# Patient Record
Sex: Female | Born: 1955 | Race: White | Hispanic: No | Marital: Married
Health system: Midwestern US, Community
[De-identification: ages and names within clinical notes are randomized; demographics above are authoritative.]

## PROBLEM LIST (undated history)

## (undated) DIAGNOSIS — J45909 Unspecified asthma, uncomplicated: Secondary | ICD-10-CM

## (undated) DIAGNOSIS — Z139 Encounter for screening, unspecified: Secondary | ICD-10-CM

## (undated) DIAGNOSIS — N840 Polyp of corpus uteri: Secondary | ICD-10-CM

## (undated) HISTORY — PX: TONSILLECTOMY AND ADENOIDECTOMY: SUR1326

## (undated) HISTORY — PX: DILATION AND CURETTAGE OF UTERUS: SHX78

---

## 2011-08-29 LAB — HM PAP SMEAR

## 2011-10-05 LAB — URINALYSIS W/ RFLX MICROSCOPIC
Bilirubin: NEGATIVE
Blood: NEGATIVE
Glucose: NEGATIVE MG/DL
Ketone: NEGATIVE MG/DL
Leukocyte Esterase: NEGATIVE
Nitrites: NEGATIVE
Protein: NEGATIVE MG/DL
Specific gravity: 1.006 (ref 1.001–1.023)
Urobilinogen: 0.2 EU/DL (ref 0.2–1.0)
pH (UA): 7 (ref 5.0–9.0)

## 2011-10-05 LAB — CBC W/O DIFF
HCT: 34.3 % — ABNORMAL LOW (ref 35.8–46.3)
HGB: 11.7 g/dL (ref 11.7–15.4)
MCH: 27.7 PG (ref 26.1–32.9)
MCHC: 34.1 g/dL (ref 31.4–35.0)
MCV: 81.1 FL (ref 79.6–97.8)
MPV: 9.9 FL — ABNORMAL LOW (ref 10.8–14.1)
PLATELET: 184 10*3/uL (ref 150–450)
RBC: 4.23 M/uL (ref 4.05–5.25)
RDW: 13.2 % (ref 11.9–14.6)
WBC: 9.8 10*3/uL (ref 4.3–11.1)

## 2011-10-05 NOTE — Other (Signed)
Verified with patient: Patient's name, name of surgeon, procedure to be performed and date of procedure. All correct in system.    Pt declined offer to see anesthesia today.  Pt aware will meet MDA in preop DOS.    Labs done per anesthesia protocols / surgeon orders: CBC and UA done per surgeon's orders (pending results). No other labs or EKG needed per anesthesia protocols or surgeon orders.    TYPE -   1B   surgery.    Patient given opportunity to ask questions related to instructions given during pre- assessment.  All questions answered. Informed pt to refer to printed materials provided and to call with any questions between now and the DOS. Phone # printed on materials given to patient.

## 2011-10-11 NOTE — Other (Signed)
CBC & UA results from 10/05/11 WNL for surgery per anesthesia guidelines.

## 2011-10-12 ENCOUNTER — Inpatient Hospital Stay: Payer: PRIVATE HEALTH INSURANCE

## 2011-10-12 LAB — HCG URINE, QL. - POC: Pregnancy test,urine (POC): NEGATIVE

## 2011-10-12 MED ADMIN — midazolam (VERSED) injection 2 mg: INTRAVENOUS | @ 17:00:00 | NDC 63323041112

## 2011-10-12 MED ADMIN — acetaminophen (OFIRMEV) infusion 1,000 mg: INTRAVENOUS | @ 18:00:00 | NDC 43825010201

## 2011-10-12 MED ADMIN — ketorolac (TORADOL) injection 30 mg: INTRAVENOUS | @ 18:00:00 | NDC 64679075801

## 2011-10-12 MED ADMIN — lactated ringers infusion: INTRAVENOUS | @ 17:00:00 | NDC 00409795309

## 2011-10-12 MED ADMIN — lidocaine (PF) (XYLOCAINE) 20 mg/mL (2 %) injection: INTRAVENOUS | @ 17:00:00 | NDC 00409428202

## 2011-10-12 MED ADMIN — famotidine (PEPCID) tablet 20 mg: ORAL | @ 17:00:00 | NDC 68084017211

## 2011-10-12 MED ADMIN — dexamethasone (DECADRON) injection: INTRAVENOUS | @ 17:00:00 | NDC 00641036725

## 2011-10-12 MED ADMIN — ondansetron (ZOFRAN) injection: INTRAVENOUS | @ 17:00:00 | NDC 00781301095

## 2011-10-12 MED ADMIN — fentaNYL citrate (PF) injection: INTRAVENOUS | @ 17:00:00 | NDC 10019003867

## 2011-10-12 MED ADMIN — lidocaine (XYLOCAINE) 10 mg/mL (1 %) injection 0.1 mL: SUBCUTANEOUS | @ 17:00:00 | NDC 00409427601

## 2011-10-12 MED ADMIN — propofol (DIPRIVAN) 10 mg/mL injection: INTRAVENOUS | @ 17:00:00 | NDC 00703285604

## 2011-10-12 MED ADMIN — lactated ringers infusion: INTRAVENOUS | @ 18:00:00 | NDC 00409795309

## 2011-10-12 MED FILL — KETOROLAC TROMETHAMINE 30 MG/ML INJECTION: 30 mg/mL (1 mL) | INTRAMUSCULAR | Qty: 1

## 2011-10-12 MED FILL — FENTANYL CITRATE (PF) 50 MCG/ML IJ SOLN: 50 mcg/mL | INTRAMUSCULAR | Qty: 2

## 2011-10-12 MED FILL — LIDOCAINE (PF) 20 MG/ML (2 %) IV SYRINGE: 100 mg/5 mL (2 %) | INTRAVENOUS | Qty: 60

## 2011-10-12 MED FILL — PROPOFOL 10 MG/ML IV EMUL: 10 mg/mL | INTRAVENOUS | Qty: 20

## 2011-10-12 MED FILL — MIDAZOLAM 1 MG/ML IJ SOLN: 1 mg/mL | INTRAMUSCULAR | Qty: 2

## 2011-10-12 MED FILL — FAMOTIDINE 20 MG TAB: 20 mg | ORAL | Qty: 1

## 2011-10-12 MED FILL — OFIRMEV 1,000 MG/100 ML (10 MG/ML) INTRAVENOUS SOLUTION: 1000 mg/100 mL (10 mg/mL) | INTRAVENOUS | Qty: 100

## 2011-10-12 MED FILL — ONDANSETRON (PF) 4 MG/2 ML INJECTION: 4 mg/2 mL | INTRAMUSCULAR | Qty: 2

## 2011-10-12 MED FILL — DEXAMETHASONE SODIUM PHOSPHATE 10 MG/ML IJ SOLN: 10 mg/mL | INTRAMUSCULAR | Qty: 1

## 2011-10-12 NOTE — Anesthesia Post-Procedure Evaluation (Signed)
Post-Anesthesia Evaluation and Assessment    Patient: Leslie Irwin MRN: [id number]  SSN: [id number]    Date of Birth: January 25, 1956  Age: 56 y.o.  Sex: female       Cardiovascular Function/Vital Signs  Visit Vitals   Item Reading   ??? BP 105/61   ??? Pulse 75   ??? Temp 36.4 ??C (97.5 ??F)   ??? Resp 75   ??? Ht 5\' 7"  (1.702 m)   ??? Wt 67.949 kg (149 lb 12.8 oz)   ??? BMI 23.46 kg/m2   ??? SpO2 100%       Patient is status post General anesthesia for Procedure(s):  DILATATION AND CURETTAGE HYSTEROSCOPY .    Nausea/Vomiting: None    Postoperative hydration reviewed and adequate.    Pain:  Pain Scale 1: Numeric (0 - 10) (10/12/11 1236)  Pain Intensity 1: 0 (10/12/11 1236)   Managed    Neurological Status:   Neuro (WDL): Exceptions to WDL (hx of migraines, tx with caffeine) (10/12/11 1257)   At baseline    Mental Status and Level of Consciousness: Alert and oriented to person, place, and time    Pulmonary Status:   O2 Device: Nasal cannula (10/12/11 1351)   Adequate oxygenation and airway patent    Complications related to anesthesia: None    Post-anesthesia assessment completed. No concerns    Signed By: Waymond Cera, MD     October 12, 2011

## 2011-10-12 NOTE — Anesthesia Pre-Procedure Evaluation (Signed)
Anesthetic History              Review of Systems / Medical History  Patient summary reviewed, nursing notes reviewed and pertinent labs reviewed    Pulmonary                 Neuro/Psych              Cardiovascular                Exercise tolerance: >4 METS     GI/Hepatic/Renal                  Endo/Other             Other Findings                        Physical Exam    Airway  Mallampati: II  TM Distance: 4 - 6 cm  Neck ROM: normal range of motion   Mouth opening: Normal     Cardiovascular  Regular rate and rhythm,  S1 and S2 normal,  no murmur, click, rub, or gallop             Dental    Dentition: Upper braces and Lower braces     Pulmonary  Breath sounds clear to auscultation               Abdominal         Other Findings                          Anesthetic Plan    ASA: 2  Anesthesia type: general          Induction: Intravenous  Anesthetic plan and risks discussed with: Patient

## 2011-10-15 NOTE — Op Note (Signed)
HYSTEROSCOPY AND D&C FULL OP NOTE    PATIENT: Leslie Irwin  MRN: [id number]      PREOPERATIVE DIAGNOSIS: UTERINE POLYP    POSTOPERATIVE DIAGNOSIS: UTERINE POLYP, cervical polyp    PROCEDURE: Hysteroscopy, dilatation & curettage    SURGEON: Montavious Wierzba, MD    ANESTHESIA: General.    ESTIMATED BLOOD LOSS:   Minimal    PATHOLOGY: Endometrial curettings    SPECIMENS:  Endometrial curettings    PROCEDURE IN DETAIL: The patient was taken to the operating room where general anesthesia was found to be adequate. Time out was done to confirm the operating procedure, surgeon, patient and site.  Once confirmed by the team, procedure was started. The patient was prepped and draped in the normal sterile fashion. A weighted speculum was placed in the vagina. A large 2-3cm cervical polyp was hanging out of the os.  Metzenbaum scissors and biopsy forceps were used to excise the cervical polyps.  The anterior lip of the cervix was grasped with a single-tooth tenaculum. The uterus was dilated with Hank dilators after sounding to 7 cm. The hysteroscope was introduced into the endometrial cavity. A survey of the endometrial cavity revealed small 2 cm fibroids in the anterior and posterior regions. Both ostia appeared clear and patent, however. No other abnormalities were seen. The hysteroscope was removed and an endometrial curetting was performed.  All specimens were sent to Pathology for examination. All instruments were removed from the vagina. Good hemostasis was assured. The patient tolerated the procedure well. Sponge, lap, and needle counts were correct times two.

## 2011-10-15 NOTE — Op Note (Signed)
HYSTEROSCOPY AND D&C FULL OP NOTE    PATIENT: Leslie Irwin  MRN: [id number]      PREOPERATIVE DIAGNOSIS: UTERINE POLYP    POSTOPERATIVE DIAGNOSIS: UTERINE POLYP, cervical polyp    PROCEDURE: Hysteroscopy, dilatation & curettage    SURGEON: Lora Paula, MD    ANESTHESIA: General.    ESTIMATED BLOOD LOSS:   Minimal    PATHOLOGY: Endometrial curettings    SPECIMENS:  Endometrial curettings    PROCEDURE IN DETAIL: The patient was taken to the operating room where general anesthesia was found to be adequate. Time out was done to confirm the operating procedure, surgeon, patient and site.  Once confirmed by the team, procedure was started. The patient was prepped and draped in the normal sterile fashion. A weighted speculum was placed in the vagina. A large 2-3cm cervical polyp was hanging out of the os.  Metzenbaum scissors and biopsy forceps were used to excise the cervical polyps.  The anterior lip of the cervix was grasped with a single-tooth tenaculum. The uterus was dilated with Hank dilators after sounding to 7 cm. The hysteroscope was introduced into the endometrial cavity. A survey of the endometrial cavity revealed small 2 cm fibroids in the anterior and posterior regions. Both ostia appeared clear and patent, however. No other abnormalities were seen. The hysteroscope was removed and an endometrial curetting was performed.  All specimens were sent to Pathology for examination. All instruments were removed from the vagina. Good hemostasis was assured. The patient tolerated the procedure well. Sponge, lap, and needle counts were correct times two.

## 2011-11-07 NOTE — Progress Notes (Signed)
HISTORY OF PRESENT ILLNESS  Leslie Irwin is a 56 y.o. female.  HPI  Patient presents today for a post-op visit.    Surgery Performed:Hysteroscopy D & C   Surgery Date: 10/12/2011  Pathology: benign cervical polyp, benign endometrial biopsy-- fibroid tissue    Plan: Patient is discharged from further care for this condition; patient will return to clinic in one year for gynecological examination unless new problems arise.  Risks of HRT discussed with the patient in detail,including heart attack, stroke, blood clots, breast cancer, and other risks. Patient understands these risks. Current studies show increase stroke risk to users of ERT. This was strongly stressed.  Review of Systems   Constitutional: Negative.    HENT: Negative.    Eyes: Negative.    Respiratory: Negative.    Cardiovascular: Negative.    Gastrointestinal: Negative.    Genitourinary: Negative.    Musculoskeletal: Negative.    Skin: Negative.    Neurological: Negative.    Endo/Heme/Allergies: Negative.    Psychiatric/Behavioral: Negative.        Physical Exam   Constitutional: She is oriented to person, place, and time. She appears well-developed and well-nourished.   HENT:   Head: Normocephalic and atraumatic.   Eyes: EOM are normal. Pupils are equal, round, and reactive to light.   Neck: Normal range of motion. Neck supple.   Cardiovascular: Normal rate, regular rhythm, S1 normal, S2 normal, normal heart sounds and intact distal pulses.    Pulmonary/Chest: Effort normal and breath sounds normal.   Abdominal: Soft. Bowel sounds are normal. There is no hepatosplenomegaly. There is no tenderness. There is no rebound, no guarding and no CVA tenderness.   Genitourinary: Rectum normal, vagina normal and uterus normal. Rectal exam shows no mass. Guaiac negative stool. No breast swelling, tenderness, discharge or bleeding. There is no rash, tenderness, lesion or injury on the right labia. There is no rash, tenderness, lesion or injury on the left labia.  Cervix exhibits no motion tenderness and no discharge. Right adnexum displays no mass, no tenderness and no fullness. Left adnexum displays no mass, no tenderness and no fullness.        Using the Tanner's scale of sexual maturation, the patient is classified as stage V; breasts have adult shape and areola assumes the same contour, pubic hair has adult female triangle distribution that has spread to the medial surface of the thighs.    External genitalia are normal in appearance..    Examination of urethral meatus reveals location normal and size normal.   Examination of urethra shows no abnormalities.   Examination of vaginal vault reveals no abnormalities.   Cervix is long and closed without pathology.    Uterine portion of bimanual exam reveals contour normal, shape regular, descensus absent, no nodularity, no tenderness and size [redacted] weeks GA.    Adnexa and parametria exam reveals right ovary and left ovary normal size, shape & contour without masses, nontender.    Examination of anus and perineum shows no abnormalities.   Rectal exam reveals normal sphincter tone without presence of hemorrhoids or masses.     Musculoskeletal: Normal range of motion.   Lymphadenopathy:     She has no axillary adenopathy.        Right: No inguinal and no supraclavicular adenopathy present.        Left: No inguinal and no supraclavicular adenopathy present.   Neurological: She is alert and oriented to person, place, and time.   Skin: Skin is warm, dry  and intact.   Psychiatric: She has a normal mood and affect. Her speech is normal and behavior is normal. Judgment and thought content normal.       ASSESSMENT and PLAN  Encounter Diagnoses   Name Primary?   ??? Cervical polyp Yes     No orders of the defined types were placed in this encounter.     Will see how her next menses treat her, if no further bleeding problems, will see at her annual next year.  Follow-up Disposition:  Return if symptoms worsen or fail to improve.

## 2011-12-17 ENCOUNTER — Encounter

## 2012-09-25 NOTE — Progress Notes (Signed)
HISTORY OF PRESENT ILLNESS  Leslie Irwin is a 57 y.o. female.  HPI  Chief Complaint   Patient presents with   ??? Mole   ??? Asthma     Past Medical History   Diagnosis Date   ??? Asthma      use inhaler daily       ROS  Visit Vitals   Item Reading   ??? BP 120/80   ??? Pulse 62   ??? Temp(Src) 97.7 ??F (36.5 ??C) (Oral)   ??? Wt 154 lb (69.854 kg)   ??? BMI 24.87 kg/m2       Physical Exam    ASSESSMENT and PLAN  1) right inner thigh with notable change in mole.  Darker color.  2) follow up for asthma and refills  She recently is divorced and will be moving to [place] to be closer to children.

## 2013-02-24 NOTE — Progress Notes (Signed)
HISTORY OF PRESENT ILLNESS  Leslie Irwin is a 58 y.o. female.  HPI Comments: [age] wf here for sk in check . She has two lesions on her laeg that need to be checked.     Mole  Pertinent negatives include no chest pain, no abdominal pain, no headaches and no shortness of breath.   Asthma  Pertinent negatives include no chest pain, no abdominal pain, no headaches and no shortness of breath.       Review of Systems   Constitutional: Negative for fever, chills and malaise/fatigue.   HENT: Negative for hearing loss and congestion.    Eyes: Negative for blurred vision and pain.   Respiratory: Negative for cough, hemoptysis and shortness of breath.    Cardiovascular: Negative for chest pain, palpitations and leg swelling.   Gastrointestinal: Negative for heartburn, nausea, abdominal pain, diarrhea, constipation and blood in stool.   Genitourinary: Negative for dysuria, urgency, frequency and hematuria.   Musculoskeletal: Negative for myalgias and joint pain.   Neurological: Negative for dizziness, sensory change and headaches.   Endo/Heme/Allergies: Negative for polydipsia. Does not bruise/bleed easily.   Psychiatric/Behavioral: Negative for depression. The patient is not nervous/anxious and does not have insomnia.        Physical Exam   Nursing note and vitals reviewed.  Constitutional: She is oriented to person, place, and time. She appears well-developed and well-nourished. No distress.   HENT:   Head: Normocephalic.   Mouth/Throat: Uvula is midline.   Eyes: Conjunctivae and EOM are normal. Pupils are equal, round, and reactive to light. Right eye exhibits no discharge. Left eye exhibits no discharge. No scleral icterus.   Neck: Normal range of motion. Neck supple. No thyromegaly present.   Cardiovascular: Normal rate, regular rhythm, normal heart sounds and intact distal pulses.  Exam reveals no gallop and no friction rub.    No murmur heard.  Pulmonary/Chest: Effort normal and breath sounds normal. She has no  wheezes. She has no rales. She exhibits no tenderness.   Abdominal: Bowel sounds are normal. She exhibits no distension. There is no tenderness.   Musculoskeletal: Normal range of motion. She exhibits no edema and no tenderness.   Neurological: She is alert and oriented to person, place, and time. She displays normal reflexes. No cranial nerve deficit. She exhibits normal muscle tone. Coordination normal.   Skin: She is not diaphoretic.            ASSESSMENT and PLAN  Skin lesions on left leg will follow, consider derm .

## 2015-07-15 ENCOUNTER — Other Ambulatory Visit: Payer: Self-pay | Admitting: Internal Medicine

## 2015-07-19 ENCOUNTER — Encounter: Payer: Self-pay | Admitting: Internal Medicine

## 2015-08-08 ENCOUNTER — Other Ambulatory Visit: Payer: Self-pay | Admitting: Internal Medicine

## 2015-08-11 ENCOUNTER — Other Ambulatory Visit: Payer: BLUE CROSS/BLUE SHIELD | Admitting: Internal Medicine

## 2015-08-11 DIAGNOSIS — Z1321 Encounter for screening for nutritional disorder: Secondary | ICD-10-CM

## 2015-08-11 DIAGNOSIS — Z1329 Encounter for screening for other suspected endocrine disorder: Secondary | ICD-10-CM

## 2015-08-11 DIAGNOSIS — Z0000 Encounter for general adult medical examination without abnormal findings: Secondary | ICD-10-CM

## 2015-08-11 DIAGNOSIS — Z130 Encounter for screening for diseases of the blood and blood-forming organs and certain disorders involving the immune mechanism: Secondary | ICD-10-CM

## 2015-08-11 DIAGNOSIS — Z13220 Encounter for screening for lipoid disorders: Secondary | ICD-10-CM

## 2015-08-11 LAB — TSH: TSH: 2.34 mIU/L

## 2015-08-11 LAB — CBC WITH DIFFERENTIAL/PLATELET
Basophils Absolute: 62 cells/uL (ref 0–200)
Basophils Relative: 1 %
Eosinophils Absolute: 124 cells/uL (ref 15–500)
Eosinophils Relative: 2 %
HEMATOCRIT: 40.9 % (ref 35.0–45.0)
Hemoglobin: 13.8 g/dL (ref 11.7–15.5)
LYMPHS PCT: 33 %
Lymphs Abs: 2046 cells/uL (ref 850–3900)
MCH: 27.9 pg (ref 27.0–33.0)
MCHC: 33.7 g/dL (ref 32.0–36.0)
MCV: 82.8 fL (ref 80.0–100.0)
MONOS PCT: 4 %
MPV: 9.3 fL (ref 7.5–12.5)
Monocytes Absolute: 248 cells/uL (ref 200–950)
Neutro Abs: 3720 cells/uL (ref 1500–7800)
Neutrophils Relative %: 60 %
Platelets: 232 10*3/uL (ref 140–400)
RBC: 4.94 MIL/uL (ref 3.80–5.10)
RDW: 13.6 % (ref 11.0–15.0)
WBC: 6.2 10*3/uL (ref 3.8–10.8)

## 2015-08-12 ENCOUNTER — Ambulatory Visit (INDEPENDENT_AMBULATORY_CARE_PROVIDER_SITE_OTHER): Payer: BLUE CROSS/BLUE SHIELD | Admitting: Internal Medicine

## 2015-08-12 ENCOUNTER — Other Ambulatory Visit (HOSPITAL_COMMUNITY)
Admission: RE | Admit: 2015-08-12 | Discharge: 2015-08-12 | Disposition: A | Discharge status: Home / Self Care | Payer: BLUE CROSS/BLUE SHIELD | Source: Ambulatory Visit | Attending: Internal Medicine | Admitting: Internal Medicine

## 2015-08-12 ENCOUNTER — Encounter: Payer: Self-pay | Admitting: Internal Medicine

## 2015-08-12 VITALS — BP 112/78 | HR 68 | Temp 98.1°F | Resp 18 | Ht 67.0 in | Wt 157.0 lb

## 2015-08-12 DIAGNOSIS — E559 Vitamin D deficiency, unspecified: Secondary | ICD-10-CM

## 2015-08-12 DIAGNOSIS — Z124 Encounter for screening for malignant neoplasm of cervix: Secondary | ICD-10-CM

## 2015-08-12 DIAGNOSIS — G4700 Insomnia, unspecified: Secondary | ICD-10-CM | POA: Diagnosis not present

## 2015-08-12 DIAGNOSIS — Z0000 Encounter for general adult medical examination without abnormal findings: Secondary | ICD-10-CM | POA: Diagnosis not present

## 2015-08-12 DIAGNOSIS — G4762 Sleep related leg cramps: Secondary | ICD-10-CM | POA: Insufficient documentation

## 2015-08-12 DIAGNOSIS — Z8709 Personal history of other diseases of the respiratory system: Secondary | ICD-10-CM

## 2015-08-12 DIAGNOSIS — Z01419 Encounter for gynecological examination (general) (routine) without abnormal findings: Secondary | ICD-10-CM | POA: Diagnosis present

## 2015-08-12 LAB — COMPLETE METABOLIC PANEL WITH GFR
ALBUMIN: 4.4 g/dL (ref 3.6–5.1)
ALK PHOS: 86 U/L (ref 33–130)
ALT: 27 U/L (ref 6–29)
AST: 18 U/L (ref 10–35)
BUN: 10 mg/dL (ref 7–25)
CALCIUM: 9.1 mg/dL (ref 8.6–10.4)
CHLORIDE: 106 mmol/L (ref 98–110)
CO2: 26 mmol/L (ref 20–31)
CREATININE: 0.78 mg/dL (ref 0.50–1.05)
GFR, Est Non African American: 84 mL/min (ref >=60)
Glucose, Bld: 83 mg/dL (ref 65–99)
POTASSIUM: 4.3 mmol/L (ref 3.5–5.3)
Sodium: 141 mmol/L (ref 135–146)
Total Bilirubin: 0.6 mg/dL (ref 0.2–1.2)
Total Protein: 6.6 g/dL (ref 6.1–8.1)

## 2015-08-12 LAB — LIPID PANEL
CHOL/HDL RATIO: 3.3 ratio (ref <=5.0)
CHOLESTEROL: 190 mg/dL (ref 125–200)
HDL: 57 mg/dL (ref >=46)
LDL Cholesterol: 102 mg/dL (ref <130)
TRIGLYCERIDES: 156 mg/dL — AB (ref <150)
VLDL: 31 mg/dL — AB (ref <30)

## 2015-08-12 LAB — VITAMIN D 25 HYDROXY (VIT D DEFICIENCY, FRACTURES): Vit D, 25-Hydroxy: 24 ng/mL — ABNORMAL LOW (ref 30–100)

## 2015-08-12 MED ORDER — ALBUTEROL SULFATE HFA 108 (90 BASE) MCG/ACT IN AERS: 2.0000 | INHALATION_SPRAY | Freq: Four times a day (QID) | RESPIRATORY_TRACT | Status: DC | PRN

## 2015-08-12 NOTE — Progress Notes (Signed)
   Subjective:    Patient ID: Caroline Monroe, female    DOB: 1955/05/22, 60 y.o.   MRN: [id number]  HPI 60 year old White Female presents to the office for the first time today.   Patient has a history of asthma diagnosed approximately 441990.  No known drug allergies  Past medical history: D and C 2010. Adenoidectomy in childhood. Sutures on face in childhood.  Has Qvar inhaler that she uses on a when necessary basis maybe once a month.  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 She divorced and moved [place] from [place] ago. Previously lived in [place] and [place]. Does not smoke. Occasional alcohol consumption.  Family history: Father living age 386 in good health. Mother died at age 58 of pancreatic cancer. 5 brothers one age 58 with history of hepatitis C and alcoholism. One age 58 with bipolar disorder. One age 58 with schizophrenia and paranoia in one age 58 with alcoholism. One brother age 58 in good health. No sisters.  Patient lost 2 children a daughter 4 days due to prematurity and another daughter at one due to see Ed's. 5 living daughters ages 7131, 434, 3129, 4125 and 5322. 60 year old has possible bulimia.  Review of Systems  Constitutional: Negative.   Respiratory:       History of asthma  Skin:       History of loss  All other systems reviewed and are negative.  issues with leg cramps. Recommend magnesium supplement.     Objective:   Physical Exam  Constitutional: She is oriented to person, place, and time. She appears well-developed and well-nourished. No distress.  HENT:  Head: Normocephalic and atraumatic.  Right Ear: External ear normal.  Mouth/Throat: Oropharynx is clear and moist.  Eyes: Conjunctivae and EOM are normal. Pupils are equal, round, and reactive to light. Right eye exhibits no discharge. Left eye exhibits no discharge. No scleral icterus.    Neck: Neck supple. No JVD present. No thyromegaly present.  Cardiovascular: Normal rate, regular rhythm, normal heart sounds and intact distal pulses.   No murmur heard. Pulmonary/Chest: Effort normal and breath sounds normal. No respiratory distress. She has no wheezes. She has no rales.  Breast normal female without masses  Abdominal: Soft. Bowel sounds are normal. She exhibits no distension and no mass. There is no tenderness. There is no rebound and no guarding.  Genitourinary:  Genitourinary Comments: Pap taken. Bimanual normal.  Musculoskeletal: She exhibits no edema.  Lymphadenopathy:    She has no cervical adenopathy.  Neurological: She is alert and oriented to person, place, and time. She has normal reflexes. No cranial nerve deficit. Coordination normal.  Skin: Skin is warm and dry. No rash noted. She is not diaphoretic.  Psychiatric: She has a normal mood and affect. Her behavior is normal. Judgment and thought content normal.  Vitals reviewed.         Assessment & Plan:  History of asthma-prescription for albuterol inhaler  Vitamin D deficiency recommend 2000 units vitamin D 3 daily  Nocturnal leg cramps-recommend magnesium 400 mg daily  Plan: See above recommendations and return in one year or as needed.

## 2015-08-12 NOTE — Patient Instructions (Signed)
Try magnesium supplement 400 mg daily for leg cramps. May try Benadryl for sleep. Take 2000 units vitamin D 3 daily for vitamin D deficiency. Have called in Ventolin inhaler to use when necessary asthma/wheezing. It was a pleasure to see you today. Return in one year or as needed.

## 2015-08-16 LAB — CYTOLOGY - PAP

## 2015-08-18 ENCOUNTER — Telehealth: Payer: Self-pay

## 2015-08-18 NOTE — Telephone Encounter (Signed)
Called patient to give lab results. No answer. Will try later.  

## 2015-09-29 ENCOUNTER — Encounter: Payer: Self-pay | Admitting: Internal Medicine

## 2015-09-29 ENCOUNTER — Other Ambulatory Visit: Payer: Self-pay | Admitting: Internal Medicine

## 2015-09-29 ENCOUNTER — Ambulatory Visit (INDEPENDENT_AMBULATORY_CARE_PROVIDER_SITE_OTHER): Payer: BLUE CROSS/BLUE SHIELD | Admitting: Internal Medicine

## 2015-09-29 VITALS — BP 102/68 | HR 74 | Temp 97.9°F | Ht 67.0 in | Wt 162.0 lb

## 2015-09-29 DIAGNOSIS — R1032 Left lower quadrant pain: Secondary | ICD-10-CM

## 2015-09-29 LAB — POCT URINALYSIS DIPSTICK
BILIRUBIN UA: NEGATIVE
GLUCOSE UA: NEGATIVE
KETONES UA: NEGATIVE
Leukocytes, UA: NEGATIVE
Nitrite, UA: NEGATIVE
PH UA: 5
Protein, UA: NEGATIVE
RBC UA: NEGATIVE
SPEC GRAV UA: 1.015
Urobilinogen, UA: 0.2

## 2015-09-29 LAB — CBC WITH DIFFERENTIAL/PLATELET
BASOS ABS: 54 {cells}/uL (ref 0–200)
BASOS PCT: 1 %
EOS ABS: 162 {cells}/uL (ref 15–500)
Eosinophils Relative: 3 %
HEMATOCRIT: 38 % (ref 35.0–45.0)
Hemoglobin: 12.8 g/dL (ref 11.7–15.5)
LYMPHS PCT: 30 %
Lymphs Abs: 1620 cells/uL (ref 850–3900)
MCH: 27.7 pg (ref 27.0–33.0)
MCHC: 33.7 g/dL (ref 32.0–36.0)
MCV: 82.3 fL (ref 80.0–100.0)
MONO ABS: 324 {cells}/uL (ref 200–950)
MONOS PCT: 6 %
MPV: 9.4 fL (ref 7.5–12.5)
Neutro Abs: 3240 cells/uL (ref 1500–7800)
Neutrophils Relative %: 60 %
PLATELETS: 176 10*3/uL (ref 140–400)
RBC: 4.62 MIL/uL (ref 3.80–5.10)
RDW: 13.3 % (ref 11.0–15.0)
WBC: 5.4 10*3/uL (ref 3.8–10.8)

## 2015-09-29 NOTE — Progress Notes (Signed)
   Subjective:    Patient ID: Caroline Monroe, female    DOB: [birth year], 60 y.o.   MRN: [id number]  HPI Patient called today asking to be seen regarding left lower quadrant abdominal pain present for about a week. Says it's become increasingly more and comfortable. She thinks it may be her ovary. Denies nausea vomiting or bright red blood per to him. No change in bowel habits. No diarrhea. No significant constipation. No fever or shaking chills.  Urinalysis is normal.    Review of Systems see above     Objective:   Physical Exam  Abdomen is soft nondistended with slight increase in bowel sounds. No hepatosplenomegaly or masses appreciated. She is tender to deep palpation in the left lower quadrant without rebound tenderness. Bimanual exam reveals no masses.      Assessment & Plan:  Left lower quadrant abdominal pain. I would like to get CT scan with contrast today but patient is concerned about cost. She would prefer to have ultrasound of the ovary first. We will arrange this for today. CBC with differential drawn and pending.  Also she has some issues with migraine headaches and needs some work accommodation. We will need to discuss this at another time as she was worked in today urgently for the left lower quadrant pain.

## 2015-09-29 NOTE — Patient Instructions (Signed)
Proceed with ultrasound of the ovary today. CBC with differential pending.

## 2015-09-30 ENCOUNTER — Ambulatory Visit
Admission: RE | Admit: 2015-09-30 | Discharge: 2015-09-30 | Disposition: A | Discharge status: Home / Self Care | Payer: BLUE CROSS/BLUE SHIELD | Source: Ambulatory Visit | Attending: Internal Medicine | Admitting: Internal Medicine

## 2015-09-30 ENCOUNTER — Telehealth: Payer: Self-pay

## 2015-09-30 DIAGNOSIS — R1032 Left lower quadrant pain: Secondary | ICD-10-CM

## 2015-09-30 NOTE — Telephone Encounter (Signed)
Patient returned call.  Caroline Monroe gave results. Patient verbalized understanding.

## 2015-09-30 NOTE — Telephone Encounter (Signed)
-----   Message from Margaree MackintoshMary J Baxley, MD sent at 09/29/2015  9:27 PM EDT ----- Please call pt. CBC especially WBC is normal.

## 2015-09-30 NOTE — Telephone Encounter (Signed)
Called patient to give lab results. No answer. Will try later.  

## 2015-10-04 ENCOUNTER — Encounter: Payer: Self-pay | Admitting: Internal Medicine

## 2015-10-04 ENCOUNTER — Telehealth: Payer: Self-pay | Admitting: Internal Medicine

## 2015-10-04 NOTE — Telephone Encounter (Signed)
Patient called earlier today about urology appointment. We have sent information to [facility].  She called back saying she was having abdominal pain. I called her just now and she said the pain was radiating upward to her upper abdomen. She just took ibuprofen. I asked her if she thought she needed to go the emergency department. Said she cannot afford $500 deductible. She has been contacted by [facility] and will be seen there on Thursday.

## 2015-10-04 NOTE — Telephone Encounter (Signed)
Spoke with patient Friday night September 1 regarding results of ultrasound. She has 10 cm renal cyst seen on ultrasound and further imaging is necessary. I think she needs urology evaluation. She was told this on September 1. Also told her that we would get her an appointment. She is calling today asking she needs to be seen here. She does not. We will get urology appointment.

## 2015-10-04 NOTE — Telephone Encounter (Signed)
Patient has 10cm cyst on Right Kidney.    Appointment made with Dr. Annabell HowellsWrenn @ [facility] for Thursday, 9/7 17 @ 10:00 a.m.  Patient worked in per [employer] @ [facility].  Faxed all demographic/clinical information to [facility] @ [phone].

## 2015-10-11 ENCOUNTER — Other Ambulatory Visit: Payer: Self-pay | Admitting: Urology

## 2015-10-11 DIAGNOSIS — N281 Cyst of kidney, acquired: Secondary | ICD-10-CM

## 2015-10-17 ENCOUNTER — Ambulatory Visit (HOSPITAL_COMMUNITY)
Admission: RE | Admit: 2015-10-17 | Discharge: 2015-10-17 | Disposition: A | Discharge status: Home / Self Care | Payer: BLUE CROSS/BLUE SHIELD | Source: Ambulatory Visit | Attending: Urology | Admitting: Urology

## 2015-10-17 DIAGNOSIS — N281 Cyst of kidney, acquired: Secondary | ICD-10-CM | POA: Diagnosis not present

## 2015-10-17 MED ORDER — GADOBENATE DIMEGLUMINE 529 MG/ML IV SOLN
15.0000 mL | Freq: Once | INTRAVENOUS | Status: AC | PRN
  Administered 2015-10-17: 15 mL via INTRAVENOUS

## 2015-10-19 ENCOUNTER — Ambulatory Visit (HOSPITAL_COMMUNITY): Payer: BLUE CROSS/BLUE SHIELD

## 2015-10-27 ENCOUNTER — Telehealth: Payer: Self-pay | Admitting: Internal Medicine

## 2015-10-27 ENCOUNTER — Encounter: Payer: Self-pay | Admitting: Internal Medicine

## 2015-10-27 NOTE — Telephone Encounter (Signed)
Called over to [facility] to see if patient has been seen in follow up since her visit with them on 9/7; spoke with [facility].  Patient was seen today by PA, Jetta Loutiane Warden.  Patient was advised by their PA to follow up in 6 months at their office on this cyst.  Will make Dr. Lenord FellersBaxley aware of this information.

## 2015-10-27 NOTE — Telephone Encounter (Signed)
Reviewed MRI report. She did have a 10.9 cm renal cyst that was complex. It was evaluated by MRI. [facility] indicates they need to follow up in 6 months with a renal ultrasound. I think that is reasonable.  I think it's also reasonable that she have a GYN exam if she still having pain and consider a colonoscopy. I did call her leaving voice mail message and indicated that there was concerned previously about this possibly being a malignant type of cyst but that MRI had rule that out.

## 2015-10-27 NOTE — Telephone Encounter (Signed)
Dr. Lenord FellersBaxley called the patient and LM for patient.    Mailing a copy of the MRI to patient.  MRI reveals 1.9 medial left upper pole renal cyst, simple. Additional 7.8 x 8.7 x 10.3 cm mildly COMPLEX cyst in the mid/lower pole of the left kidney, notal for irregular margins and a mildly irregular thin septation.  THIS cyst is the reason that the patient was sent to [facility] = 10cm cyst.    Today, patient continues to complain of pain on the left side (which is consistent with these findings).  States that she was told by Dr. Annabell HowellsWrenn that this was a simple cyst and normal, nothing to worry about.

## 2015-10-27 NOTE — Telephone Encounter (Signed)
Patient would like to speak with you regarding the cyst that was found on the original U/S that she had.  States that when you called her on the evening of the U/S; you stated that the cyst was about the size of a grapefruit and this was not something that we wanted to mess around with.  She went to see Dr. Annabell HowellsWrenn and had an MRI and has invested about $2500 and the MRI revealed a normal size cyst that they really aren't concerned about.    She would like to know what you saw?  And, wants to know what you were concerned about?  States she's still in pain, still in the same area (left side).  States that Dr. Annabell HowellsWrenn states that the MRI is not covering that area and doesn't really reveal anything to conclude why she's having pain.    She wants to know if you would like to wipe your hands clean of it and send her to an OB-GYN to find out what's going on?  However, she's now $2500 deeper in debt, still in pain.  States, she does believe in miracles and perhaps something may have been there and now it's gone, but Dr. Annabell HowellsWrenn didn't find anything.  And, she's still having pain.  So, she would like to know what the next step should be in finding out what is going on with her.    And, she would like to speak with you about what you originally saw that made you think that the cyst was the size of a grapefruit and see if you could help her to understand what may have happened.    Best contact # [phone]

## 2015-11-10 ENCOUNTER — Other Ambulatory Visit: Payer: Self-pay | Admitting: Internal Medicine

## 2015-11-10 DIAGNOSIS — N281 Cyst of kidney, acquired: Secondary | ICD-10-CM

## 2015-11-17 ENCOUNTER — Other Ambulatory Visit: Payer: Self-pay | Admitting: Radiology

## 2015-11-18 ENCOUNTER — Ambulatory Visit (HOSPITAL_COMMUNITY)
Admission: RE | Admit: 2015-11-18 | Discharge: 2015-11-18 | Disposition: A | Discharge status: Home / Self Care | Payer: BLUE CROSS/BLUE SHIELD | Source: Ambulatory Visit | Attending: Internal Medicine | Admitting: Internal Medicine

## 2015-11-18 ENCOUNTER — Encounter (HOSPITAL_COMMUNITY): Payer: Self-pay

## 2015-11-18 DIAGNOSIS — J45909 Unspecified asthma, uncomplicated: Secondary | ICD-10-CM | POA: Insufficient documentation

## 2015-11-18 DIAGNOSIS — Z91013 Allergy to seafood: Secondary | ICD-10-CM | POA: Diagnosis not present

## 2015-11-18 DIAGNOSIS — N281 Cyst of kidney, acquired: Secondary | ICD-10-CM | POA: Insufficient documentation

## 2015-11-18 DIAGNOSIS — Z79899 Other long term (current) drug therapy: Secondary | ICD-10-CM | POA: Insufficient documentation

## 2015-11-18 HISTORY — DX: Unspecified asthma, uncomplicated: J45.909

## 2015-11-18 LAB — CBC WITH DIFFERENTIAL/PLATELET
BASOS ABS: 0 10*3/uL (ref 0.0–0.1)
Basophils Relative: 1 %
EOS ABS: 0.1 10*3/uL (ref 0.0–0.7)
EOS PCT: 2 %
HCT: 39.3 % (ref 36.0–46.0)
Hemoglobin: 13.1 g/dL (ref 12.0–15.0)
LYMPHS PCT: 35 %
Lymphs Abs: 2.1 10*3/uL (ref 0.7–4.0)
MCH: 27.8 pg (ref 26.0–34.0)
MCHC: 33.3 g/dL (ref 30.0–36.0)
MCV: 83.4 fL (ref 78.0–100.0)
MONO ABS: 0.3 10*3/uL (ref 0.1–1.0)
Monocytes Relative: 6 %
Neutro Abs: 3.4 10*3/uL (ref 1.7–7.7)
Neutrophils Relative %: 56 %
PLATELETS: 184 10*3/uL (ref 150–400)
RBC: 4.71 MIL/uL (ref 3.87–5.11)
RDW: 12.7 % (ref 11.5–15.5)
WBC: 6 10*3/uL (ref 4.0–10.5)

## 2015-11-18 LAB — BASIC METABOLIC PANEL
Anion gap: 8 (ref 5–15)
BUN: 19 mg/dL (ref 6–20)
CALCIUM: 9.3 mg/dL (ref 8.9–10.3)
CO2: 24 mmol/L (ref 22–32)
CREATININE: 0.7 mg/dL (ref 0.44–1.00)
Chloride: 106 mmol/L (ref 101–111)
GFR calc Af Amer: >60 mL/min (ref >60)
GLUCOSE: 91 mg/dL (ref 65–99)
POTASSIUM: 3.7 mmol/L (ref 3.5–5.1)
Sodium: 138 mmol/L (ref 135–145)

## 2015-11-18 LAB — PROTIME-INR
INR: 0.99
Prothrombin Time: 13.1 seconds (ref 11.4–15.2)

## 2015-11-18 MED ORDER — FENTANYL CITRATE (PF) 100 MCG/2ML IJ SOLN
INTRAMUSCULAR | Status: AC | PRN
  Administered 2015-11-18 (×2): 25 ug via INTRAVENOUS
  Administered 2015-11-18: 50 ug via INTRAVENOUS

## 2015-11-18 MED ORDER — MIDAZOLAM HCL 2 MG/2ML IJ SOLN
INTRAMUSCULAR | Status: AC
  Filled 2015-11-18: qty 6

## 2015-11-18 MED ORDER — MIDAZOLAM HCL 2 MG/2ML IJ SOLN
INTRAMUSCULAR | Status: AC | PRN
  Administered 2015-11-18 (×5): 1 mg via INTRAVENOUS

## 2015-11-18 MED ORDER — SODIUM CHLORIDE 0.9 % IV SOLN
INTRAVENOUS | Status: DC
  Administered 2015-11-18: 12:00:00 via INTRAVENOUS

## 2015-11-18 MED ORDER — FENTANYL CITRATE (PF) 100 MCG/2ML IJ SOLN
INTRAMUSCULAR | Status: AC
  Filled 2015-11-18: qty 4

## 2015-11-18 NOTE — H&P (Signed)
Chief Complaint: Patient was seen in consultation today for left renal cyst at the request of Baxley,Mary J  Referring Physician(s): Margaree Mackintosh  Supervising Physician: Gilmer Mor  Patient Status: [facility] - Out-pt  History of Present Illness: Caroline Monroe is a 60 y.o. female found to have a left renal cyst that is causing some symptoms. She is referred for US guided aspiration of this. Chart, PMHx, meds, labs, imaging reviewed. Has been NPO this am.  Past Medical History:  Diagnosis Date  . Asthma     Past Surgical History:  Procedure Laterality Date  . CESAREAN SECTION    . DILATION AND CURETTAGE OF UTERUS    . TONSILLECTOMY AND ADENOIDECTOMY      Allergies: Shellfish allergy  Medications:  Current Outpatient Prescriptions:  .  albuterol (ACCUNEB) 1.25 MG/3ML nebulizer solution, Take 1 ampule by nebulization every 6 (six) hours as needed for wheezing., Disp: , Rfl:  .  albuterol (PROVENTIL HFA;VENTOLIN HFA) 108 (90 Base) MCG/ACT inhaler, Inhale 2 puffs into the lungs every 6 (six) hours as needed for wheezing or shortness of breath. (Patient not taking: Reported on 11/18/2015), Disp: 1 Inhaler, Rfl: 0  Current Facility-Administered Medications:  .  0.9 %  sodium chloride infusion, , Intravenous, Continuous, Darrell K Allred, PA-C, Last Rate: 50 mL/hr at 11/18/15 1139    History reviewed. No pertinent family history.  Social History   Social History  . Marital status: Divorced    Spouse name: N/A  . Number of children: N/A  . Years of education: N/A   Social History Main Topics  . Smoking status: Never Smoker  . Smokeless tobacco: None  . Alcohol use 0.0 oz/week     Comment: once or twice monthly  . Drug use: No  . Sexual activity: Not Asked   Other Topics Concern  . None   Social History Narrative  . None     Review of Systems: A 12 point ROS discussed and pertinent positives are indicated in the HPI above.  All other systems are  negative.  Review of Systems  Vital Signs: BP 116/72 (BP Location: Left Arm)   Pulse 79   Temp 97.9 F (36.6 C) (Oral)   Resp 18   Ht 5\' 7"  (1.702 m)   Wt 160 lb (72.6 kg)   SpO2 99%   BMI 25.06 kg/m   Physical Exam  Constitutional: She is oriented to person, place, and time. She appears well-developed and well-nourished. No distress.  HENT:  Head: Normocephalic.  Mouth/Throat: Oropharynx is clear and moist.  Neck: Normal range of motion. No JVD present. No tracheal deviation present.  Cardiovascular: Normal rate, regular rhythm and normal heart sounds.   Pulmonary/Chest: Effort normal and breath sounds normal. No respiratory distress. She has no wheezes.  Abdominal: Soft. She exhibits no mass. There is no tenderness. There is no guarding.  Neurological: She is alert and oriented to person, place, and time.  Psychiatric: She has a normal mood and affect. Judgment normal.      Mallampati Score:  MD Evaluation Airway: WNL Heart: WNL Abdomen: WNL Chest/ Lungs: WNL ASA  Classification: 2 Mallampati/Airway Score: One  Imaging: No results found.  Labs:  CBC:  Recent Labs  08/11/15 1112 09/29/15 1254 11/18/15 1122  WBC 6.2 5.4 6.0  HGB 13.8 12.8 13.1  HCT 40.9 38.0 39.3  PLT 232 176 184    COAGS:  Recent Labs  11/18/15 1122  INR 0.99    BMP:  Recent  Labs  08/11/15 1112 11/18/15 1122  NA 141 138  K 4.3 3.7  CL 106 106  CO2 26 24  GLUCOSE 83 91  BUN 10 19  CALCIUM 9.1 9.3  CREATININE 0.78 0.70  GFRNONAA 84 >60  GFRAA >89 >60    LIVER FUNCTION TESTS:  Recent Labs  08/11/15 1112  BILITOT 0.6  AST 18  ALT 27  ALKPHOS 86  PROT 6.6  ALBUMIN 4.4    TUMOR MARKERS: No results for input(s): AFPTM, CEA, CA199, CHROMGRNA in the last 8760 hours.  Assessment and Plan: Large left renal cyst For US guided aspiration Labs ok Risks and Benefits discussed with the patient including, but not limited to bleeding, infection, damage to  adjacent structures or low yield requiring additional tests. All of the patient's questions were answered, patient is agreeable to proceed. Consent signed and in chart.    Thank you for this interesting consult.  A copy of this report was sent to the requesting provider on this date.  Electronically Signed: Brayton ElBRUNING, Kharon Hixon 11/18/2015, 12:55 PM   I spent a total of 20 minutes  in face to face in clinical consultation, greater than 50% of which was counseling/coordinating care for renal cyst aspiration

## 2015-11-18 NOTE — Discharge Instructions (Signed)
Needle Biopsy, Care After °These instructions give you information about caring for yourself after your procedure. Your doctor may also give you more specific instructions. Call your doctor if you have any problems or questions after your procedure. °HOME CARE °· Rest as told by your doctor. °· Take medicines only as told by your doctor. °· There are many different ways to close and cover the biopsy site, including stitches (sutures), skin glue, and adhesive strips. Follow instructions from your doctor about: °¨ How to take care of your biopsy site. °¨ When and how you should change your bandage (dressing). °¨ When you should remove your dressing. °¨ Removing whatever was used to close your biopsy site. °· Check your biopsy site every day for signs of infection. Watch for: °¨ Redness, swelling, or pain. °¨ Fluid, blood, or pus. °GET HELP IF: °· You have a fever. °· You have redness, swelling, or pain at the biopsy site, and it lasts longer than a few days. °· You have fluid, blood, or pus coming from the biopsy site. °· You feel sick to your stomach (nauseous). °· You throw up (vomit). °GET HELP RIGHT AWAY IF: °· You are short of breath. °· You have trouble breathing. °· Your chest hurts. °· You feel dizzy or you pass out (faint). °· You have bleeding that does not stop with pressure or a bandage. °· You cough up blood. °· Your belly (abdomen) hurts. °  °This information is not intended to replace advice given to you by your health care provider. Make sure you discuss any questions you have with your health care provider. °  °Document Released: 12/29/2007 Document Revised: 06/01/2014 Document Reviewed: 01/11/2014 °Elsevier Interactive Patient Education ©2016 Elsevier Inc. ° ° ° °Moderate Conscious Sedation, Adult °Sedation is the use of medicines to promote relaxation and relieve discomfort and anxiety. Moderate conscious sedation is a type of sedation. Under moderate conscious sedation you are less alert than  normal but are still able to respond to instructions or stimulation. Moderate conscious sedation is used during short medical and dental procedures. It is milder than deep sedation or general anesthesia and allows you to return to your regular activities sooner. °LET YOUR HEALTH CARE PROVIDER KNOW ABOUT:  °· Any allergies you have. °· All medicines you are taking, including vitamins, herbs, eye drops, creams, and over-the-counter medicines. °· Use of steroids (by mouth or creams). °· Previous problems you or members of your family have had with the use of anesthetics. °· Any blood disorders you have. °· Previous surgeries you have had. °· Medical conditions you have. °· Possibility of pregnancy, if this applies. °· Use of cigarettes, alcohol, or illegal drugs. °RISKS AND COMPLICATIONS °Generally, this is a safe procedure. However, as with any procedure, problems can occur. Possible problems include: °· Oversedation. °· Trouble breathing on your own. You may need to have a breathing tube until you are awake and breathing on your own. °· Allergic reaction to any of the medicines used for the procedure. °BEFORE THE PROCEDURE °· You may have blood tests done. These tests can help show how well your kidneys and liver are working. They can also show how well your blood clots. °· A physical exam will be done.   °· Only take medicines as directed by your health care provider. You may need to stop taking medicines (such as blood thinners, aspirin, or nonsteroidal anti-inflammatory drugs) before the procedure.   °· Do not eat or drink at least 6 hours before the procedure or   as directed by your health care provider. °· Arrange for a responsible adult, family member, or friend to take you home after the procedure. He or she should stay with you for at least 24 hours after the procedure, until the medicine has worn off. °PROCEDURE  °· An intravenous (IV) catheter will be inserted into one of your veins. Medicine will be able to  flow directly into your body through this catheter. You may be given medicine through this tube to help prevent pain and help you relax. °· The medical or dental procedure will be done. °AFTER THE PROCEDURE °· You will stay in a recovery area until the medicine has worn off. Your blood pressure and pulse will be checked.   °·  Depending on the procedure you had, you may be allowed to go home when you can tolerate liquids and your pain is under control. °  °This information is not intended to replace advice given to you by your health care provider. Make sure you discuss any questions you have with your health care provider. °  °Document Released: 10/10/2000 Document Revised: 02/05/2014 Document Reviewed: 09/22/2012 °Elsevier Interactive Patient Education ©2016 Elsevier Inc. ° °

## 2015-11-18 NOTE — Procedures (Signed)
Interventional Radiology Procedure Note  Procedure: US guided cyst aspiration, left kidney.  ~190cc of thin yellow fluid aspirated.   .  Complications: None  Recommendations:  - Ok to shower tomorrow - Do not submerge for 7 days - Routine care - sample to the lab   Signed,  Yvone NeuJaime S. Loreta AveWagner, DO

## 2015-11-23 LAB — AEROBIC/ANAEROBIC CULTURE W GRAM STAIN (SURGICAL/DEEP WOUND): Culture: NO GROWTH

## 2015-11-23 LAB — AEROBIC/ANAEROBIC CULTURE (SURGICAL/DEEP WOUND)

## 2016-02-03 ENCOUNTER — Encounter: Payer: Self-pay | Admitting: Internal Medicine

## 2016-02-03 ENCOUNTER — Ambulatory Visit (INDEPENDENT_AMBULATORY_CARE_PROVIDER_SITE_OTHER): Payer: BLUE CROSS/BLUE SHIELD | Admitting: Internal Medicine

## 2016-02-03 VITALS — BP 122/76 | HR 74 | Temp 98.2°F

## 2016-02-03 DIAGNOSIS — M545 Low back pain, unspecified: Secondary | ICD-10-CM

## 2016-02-03 MED ORDER — CYCLOBENZAPRINE HCL 10 MG PO TABS: 10.0000 mg | ORAL_TABLET | Freq: Three times a day (TID) | ORAL | 0 refills | Status: DC | PRN

## 2016-02-03 NOTE — Patient Instructions (Signed)
Ice or heat to right back. Flexeril up to 1010 mg 3 times daily as needed for muscle spasm. May try 10 TENS unit. If not better next week consider physical therapy.

## 2016-02-03 NOTE — Progress Notes (Signed)
   Subjective:    Patient ID: Caroline Monroe, female    DOB: 02-17-1955, 61 y.o.   MRN: [id number]  HPI 61 year old Female with 2 day history of back pain. Pain is below the right thoracic cage posteriorly. There is some point tenderness there. Doesn't recall any injury or exercise that would've caused the pain. Says she does some twisting in her seat at work to get files etc.  Left renal cyst that was discovered September 2017 was aspirated October 2017 via ultrasound. 190 mL thin yellow fluid was aspirated.        Review of Systems     Objective:   Physical Exam Straight leg raising is negative on the right. She has palpable right paralumbar tenderness particularly towards the right axillary line. Chest is clear to auscultation without rales or wheezing. She is moving slowly today secondary to pain       Assessment & Plan:  Musculoskeletal pain right perilumbar area  Plan: She may continue with ibuprofen 800 mg 3 times daily as she has been doing. Prescription for Flexeril 10 mg up to 3 times daily. Ice or heat to the right. Lumbar area when necessary. Says daughter has a TENS unit and she may want to try that. She may need physical therapy.

## 2016-02-06 ENCOUNTER — Telehealth: Payer: Self-pay | Admitting: Internal Medicine

## 2016-02-06 MED ORDER — PREDNISONE 10 MG (21) PO TBPK: 10.0000 mg | ORAL_TABLET | ORAL | 0 refills | Status: DC

## 2016-02-06 NOTE — Telephone Encounter (Signed)
Dosepak sent to pharmacy.  Patient aware.

## 2016-02-06 NOTE — Telephone Encounter (Signed)
Patient is still having issues with her back.  She says she cannot even function.  She is taking her medication but it only takes the edge off.  Patient would like to know if there is something else she can do?  Physical therapy?  Patient says her pain is 9/10.  Please advise.

## 2016-02-06 NOTE — Telephone Encounter (Signed)
I recommend a Sterapred DS 10 mg 6 day Dosepak. If she needs narcotics, we will need for her to pick up a prescription. I really think this will go away with a little more time.

## 2016-02-17 ENCOUNTER — Ambulatory Visit (INDEPENDENT_AMBULATORY_CARE_PROVIDER_SITE_OTHER): Payer: BLUE CROSS/BLUE SHIELD | Admitting: Internal Medicine

## 2016-02-17 ENCOUNTER — Encounter: Payer: Self-pay | Admitting: Internal Medicine

## 2016-02-17 VITALS — BP 120/80 | HR 87 | Wt 165.0 lb

## 2016-02-17 DIAGNOSIS — K121 Other forms of stomatitis: Secondary | ICD-10-CM

## 2016-02-17 NOTE — Progress Notes (Signed)
   Subjective:    Patient ID: Caroline Monroe, female    DOB: 1955/08/02, 61 y.o.   MRN: [id number]  HPI  1 week history of area on hard palate that is superficially abraded or ulcerated. No history of recent URI. Doesn't recall consuming any hot food or or liquid to a caused trauma to the area.  With regard to back pain, it improved after about a week. She no longer complains of that.    Review of Systems see above     Objective:   Physical Exam  Has approximately 5 mm abraded area hard palate that is superficial. Margins are clean.      Assessment & Plan:  Abrasion hard palate versus Apthous ulcer. No other lesions noted in mouth.  Plan: Discussed treatment such as antibiotic her Magic mouthwash. Really I think we can just watch this and I expect it to heal in 7-10 days.

## 2016-02-17 NOTE — Patient Instructions (Signed)
No treatment at this time. Please call back if not healed in 7-10 days

## 2016-03-06 ENCOUNTER — Ambulatory Visit (INDEPENDENT_AMBULATORY_CARE_PROVIDER_SITE_OTHER): Payer: BLUE CROSS/BLUE SHIELD | Admitting: Internal Medicine

## 2016-03-06 ENCOUNTER — Encounter: Payer: Self-pay | Admitting: Internal Medicine

## 2016-03-06 VITALS — BP 100/72 | HR 116 | Temp 99.4°F | Wt 165.0 lb

## 2016-03-06 DIAGNOSIS — J4521 Mild intermittent asthma with (acute) exacerbation: Secondary | ICD-10-CM | POA: Diagnosis not present

## 2016-03-06 DIAGNOSIS — B9789 Other viral agents as the cause of diseases classified elsewhere: Secondary | ICD-10-CM

## 2016-03-06 DIAGNOSIS — J988 Other specified respiratory disorders: Secondary | ICD-10-CM

## 2016-03-06 MED ORDER — ALBUTEROL SULFATE HFA 108 (90 BASE) MCG/ACT IN AERS: 2.0000 | INHALATION_SPRAY | Freq: Four times a day (QID) | RESPIRATORY_TRACT | 2 refills | Status: AC | PRN

## 2016-03-06 MED ORDER — AZITHROMYCIN 250 MG PO TABS: ORAL_TABLET | ORAL | 0 refills | Status: DC

## 2016-03-06 NOTE — Progress Notes (Signed)
   Subjective:    Patient ID: Caroline Monroe, female    DOB: [birth year], 61 y.o.   MRN: [id number]  HPI  61 year old Female with four-day history of respiratory infection symptoms. Has had some chills and malaise with decreased energy. Has had cough and wheezing. Did receive flu vaccine through employment. No significant sore throat. A lot of coughing.    Review of Systems     Objective:   Physical Exam She has some inspiratory wheezing that clears with coughing. TMs are clear. Pharynx is very slightly injected. Neck is supple without adenopathy. Skin is warm and dry.       Assessment & Plan:  Viral respiratory infection  Asthmatic bronchitis  Plan: Zithromax Z-PAK take 2 tablets day one followed by 1 tablet days 2 through 5. Offered to give her prednisone for wheezing but she says it keeps her up for days and she cannot tolerate it. Gave her albuterol inhaler 2 sprays by mouth 4 times daily. Rest and drink plenty of fluids. Note be out of work for couple of days if necessary.

## 2016-03-06 NOTE — Patient Instructions (Signed)
Albuterol inhaler 2 sprays by mouth 4 times daily. Zithromax Z-PAK take as directed. Over-the-counter cough syrup. Rest and drink plenty of fluids.

## 2016-04-09 ENCOUNTER — Ambulatory Visit: Payer: BLUE CROSS/BLUE SHIELD | Admitting: Internal Medicine

## 2016-04-10 ENCOUNTER — Ambulatory Visit (INDEPENDENT_AMBULATORY_CARE_PROVIDER_SITE_OTHER): Payer: BLUE CROSS/BLUE SHIELD | Admitting: Internal Medicine

## 2016-04-10 ENCOUNTER — Encounter: Payer: Self-pay | Admitting: Internal Medicine

## 2016-04-10 VITALS — BP 102/78 | HR 64 | Temp 98.7°F | Ht 67.0 in | Wt 165.0 lb

## 2016-04-10 DIAGNOSIS — F439 Reaction to severe stress, unspecified: Secondary | ICD-10-CM

## 2016-04-10 DIAGNOSIS — Z8669 Personal history of other diseases of the nervous system and sense organs: Secondary | ICD-10-CM

## 2016-04-10 MED ORDER — ELETRIPTAN HYDROBROMIDE 40 MG PO TABS: 40.0000 mg | ORAL_TABLET | ORAL | 0 refills | Status: AC | PRN

## 2016-04-10 MED ORDER — CYCLOBENZAPRINE HCL 10 MG PO TABS: 10.0000 mg | ORAL_TABLET | Freq: Every day | ORAL | 0 refills | Status: AC

## 2016-04-10 NOTE — Progress Notes (Signed)
   Subjective:    Patient ID: Caroline Monroe, female    DOB: 08-21-1955, 61 y.o.   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 This was a transition for her. She's not licensed to teach in [place] but is considering a job at [employer] in [place]. She moved here from [place]. She has 2 daughters here in an ex-husband who is had some brain issues treated at [facility]. She lives alone. About every 3 weeks, she's been getting a severe headache that last for couple of days not responding to over-the-counter migraine medication. Work is stressful. Her neck gets stiff and sore. She's been seeing a chiropractor for that.  She is missing work due to the migraine headaches. She missed yesterday.  Says coworkers have been difficult to deal with. Says office management has been supportive.  Asked about depression. Says she doesn't think she is depressed but stressed about going to work on a daily basis.  Has been doing line dancing for exercise. Reads at night when she gets home from work.  Recommended icing her neck pain for 20 minutes daily. She hasn't thought of that. I asked if soft cervical collar would help and she's not sure. Apparently neck feels stiff and sore on a daily basis.    Review of Systems no nausea and vomiting just protracted headache. No visual disturbance. Had eye exam about 6 months ago and was told she had an early cataract.     Objective:   Physical Exam  Constitutional: She is oriented to person, place, and time. She appears well-developed and well-nourished.  Neck: No thyromegaly present.  Cardiovascular: Normal rate and regular rhythm.   Pulmonary/Chest: She has no wheezes. She has no rales.  Neurological: She is alert and oriented to person, place, and time. She has normal reflexes. She displays normal reflexes. No cranial nerve deficit. Coordination normal.  Fundi  not well seen. PERRLA. Extraocular movements are full. Cranial nerves II through XII are grossly intact.  Skin: Skin is warm and dry. She is not diaphoretic.  Psychiatric: She has a normal mood and affect. Her behavior is normal. Judgment and thought content normal.          Assessment & Plan:  Migraine headaches  Situational stress  Neck pain  Plan: Patient agrees to try muscle relaxant 10 mg with directions one half tablet at bedtime for neck soreness/pain. Agrees to try Relpax 40 mg at onset of migraine. Does not want to be on antidepressant medication for migraine or narcotic pain medication to abort or treat migraine not responding to Relpax. She may need to see Neurologist for consultation. She may be a candidate for injections for pain by a Neurologist

## 2016-04-10 NOTE — Patient Instructions (Signed)
Trial of Relpax at onset of migraine. May try Flexeril 10 mg tablet one half tablet at bedtime for neck pain. Ice neck for 20 minutes daily. Frequently get up from sitting position move shoulders and rotate head. Note given to be out of work yesterday. May need neurology consultation.

## 2016-07-04 ENCOUNTER — Telehealth: Payer: Self-pay

## 2016-07-04 NOTE — Telephone Encounter (Signed)
Pt called and said that she has had a migraine for 3 days now and knows that she was seen before for this and was wondering if a medication can be sent in for her for prevention or what you recommend that she do next she has missed many days of work for this and is not wanting to have to keep missing days for migraines. Please advise.   [phone]

## 2016-07-04 NOTE — Telephone Encounter (Signed)
Needs office visit. We do not call in meds for migraines

## 2016-07-05 NOTE — Telephone Encounter (Signed)
Appt made

## 2016-07-09 ENCOUNTER — Ambulatory Visit: Payer: BLUE CROSS/BLUE SHIELD | Admitting: Internal Medicine

## 2016-08-14 ENCOUNTER — Other Ambulatory Visit: Payer: BLUE CROSS/BLUE SHIELD | Admitting: Internal Medicine

## 2016-08-15 ENCOUNTER — Telehealth: Payer: Self-pay | Admitting: Internal Medicine

## 2016-08-15 NOTE — Telephone Encounter (Signed)
Called patient on Tuesday, 08/14/16 to advise of missed fasting lab appointment related to her upcoming physical exam scheduled for Friday, 08/17/16 @ 2:00 p.m.  Left message for patient advising that she could reschedule for Thursday, 7/19 for fasting labs.  If unable to come on Thursday for labs, we would need to reschedule or physical exam.  And, if she needed to reschedule, to please provide a 24 hour notice as this is our protocol or we do charge a $50 no show fee.    Patient called back in the afternoon to advise that she had been trying to contact our office to cancel her lab appointment on Tuesday morning and had been unable to get through due to a busy line.  Stated that she was no longer working at [employer].  For this reason, she no longer has insurance.  She stated that she would need to cancel her appointment on Friday, 7/20 and she was going to be moving to [place] in the near future.  Advised I would be happy to cancel her appointment on Friday.    Wished her well.

## 2016-08-17 ENCOUNTER — Encounter: Payer: BLUE CROSS/BLUE SHIELD | Admitting: Internal Medicine

## 2018-06-08 IMAGING — US US PELVIS COMPLETE
1 series · 13 of 25 positions shown · non-contrast
Comparison: None

CLINICAL DATA: Left lower quadrant pain for 1 week. Postmenopausal
female.

EXAM:
TRANSABDOMINAL AND TRANSVAGINAL ULTRASOUND OF PELVIS
TECHNIQUE: Both transabdominal and transvaginal ultrasound examinations of the
pelvis were performed. Transabdominal technique was performed for
global imaging of the pelvis including uterus, ovaries, adnexal
regions, and pelvic cul-de-sac. It was necessary to proceed with
endovaginal exam following the transabdominal exam to visualize the
endometrial stripe and ovaries.

[Series 1: us pelvis complete · 0.30mm/px · 13 of 53 slices shown]
[im 1/53]
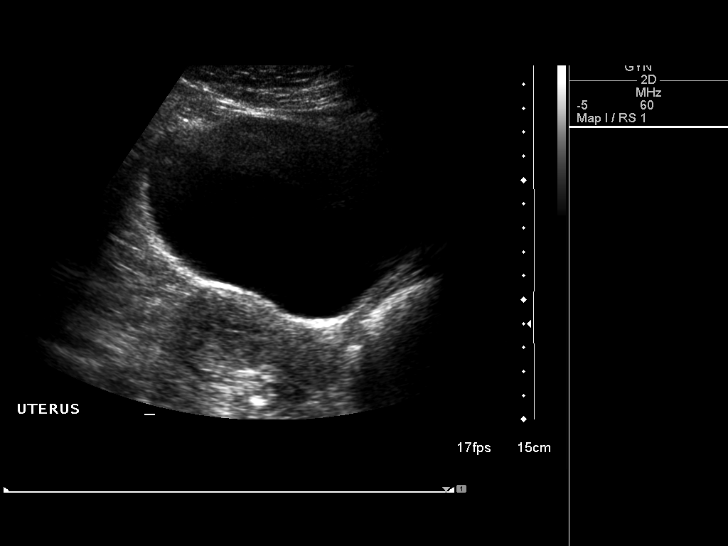
[im 5/53]
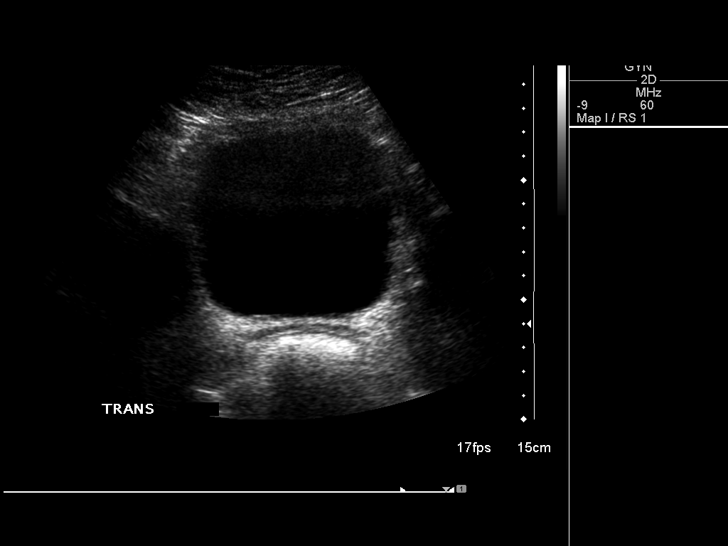
[im 9/53]
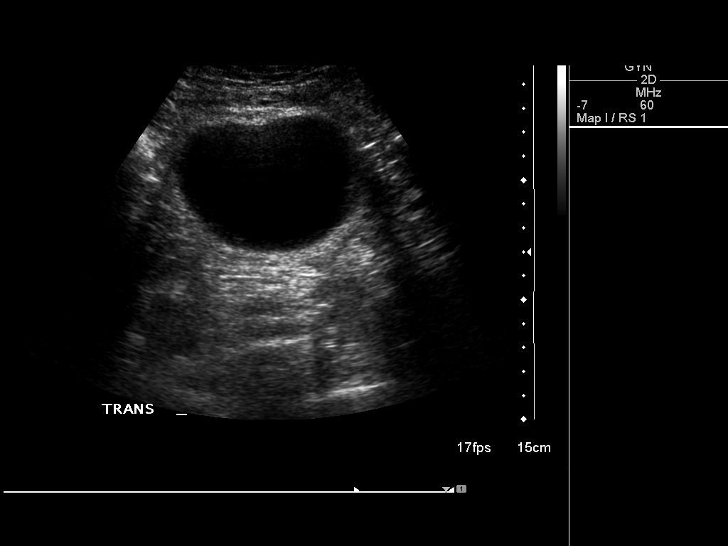
[im 14/53]
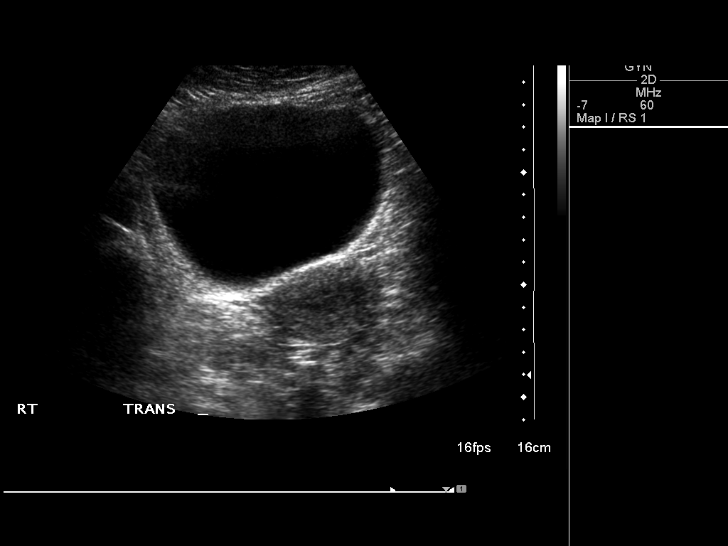
[im 18/53]
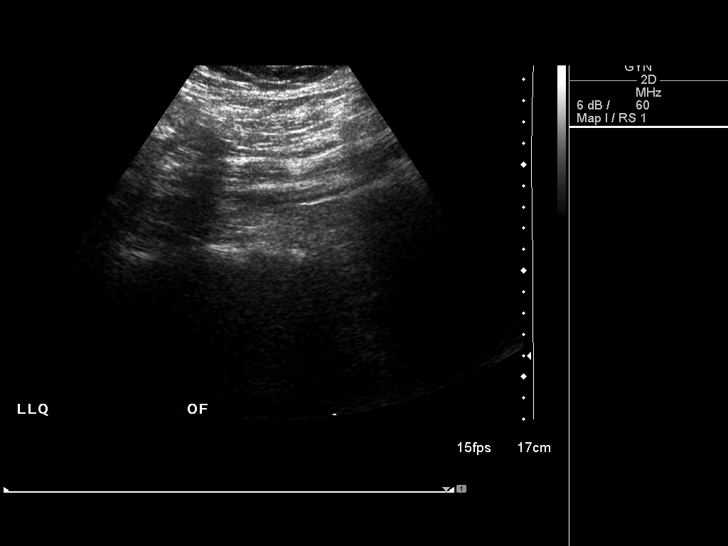
[im 22/53]
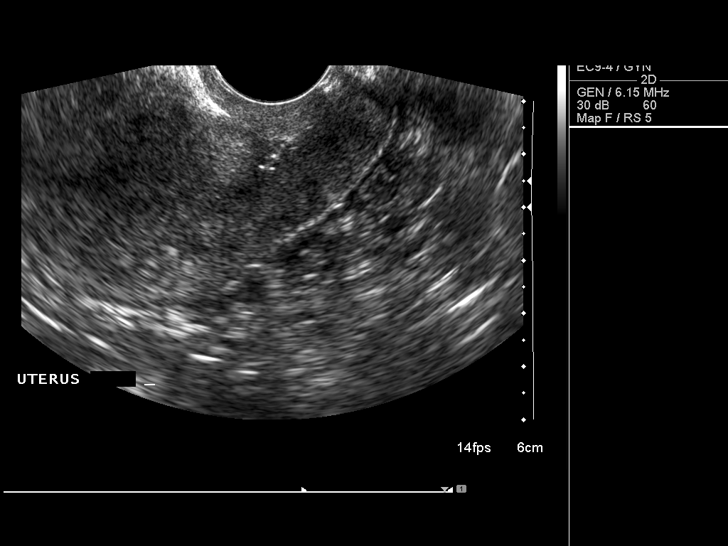
[im 27/53]
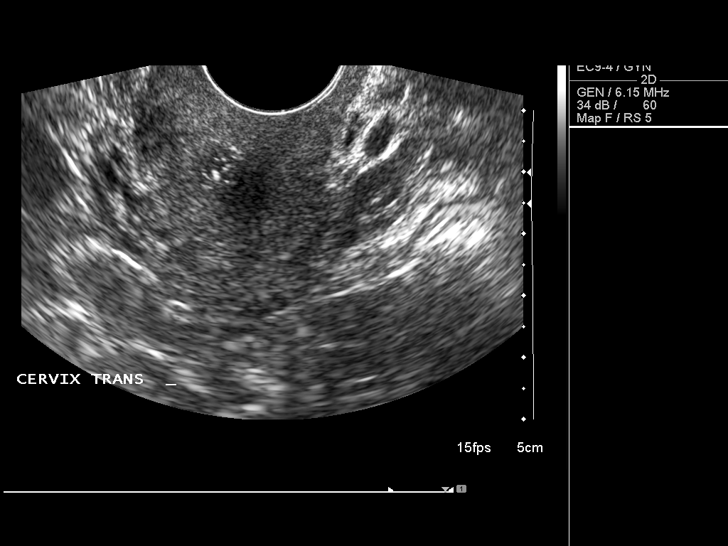
[im 31/53]
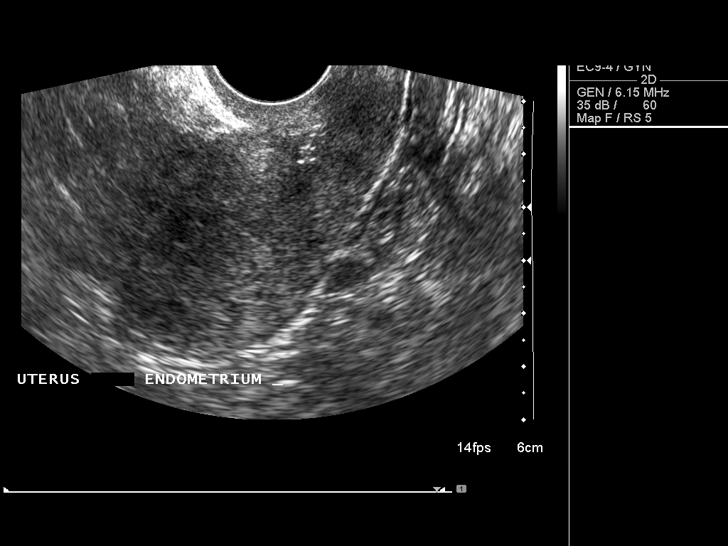
[im 35/53]
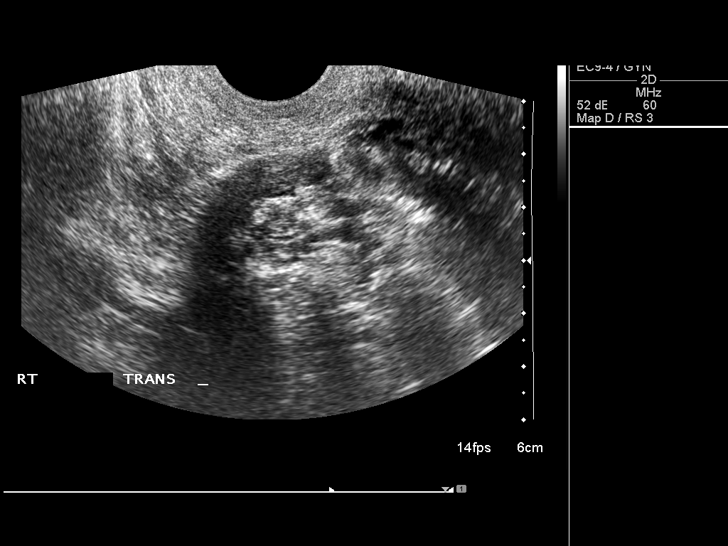
[im 40/53]
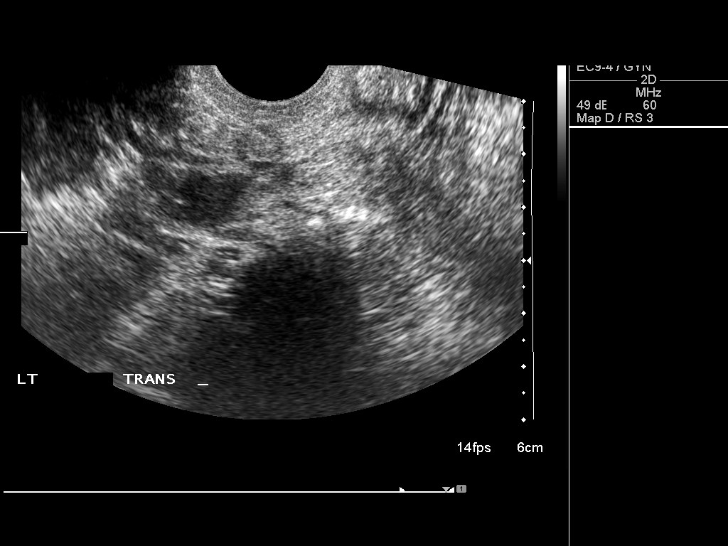
[im 44/53]
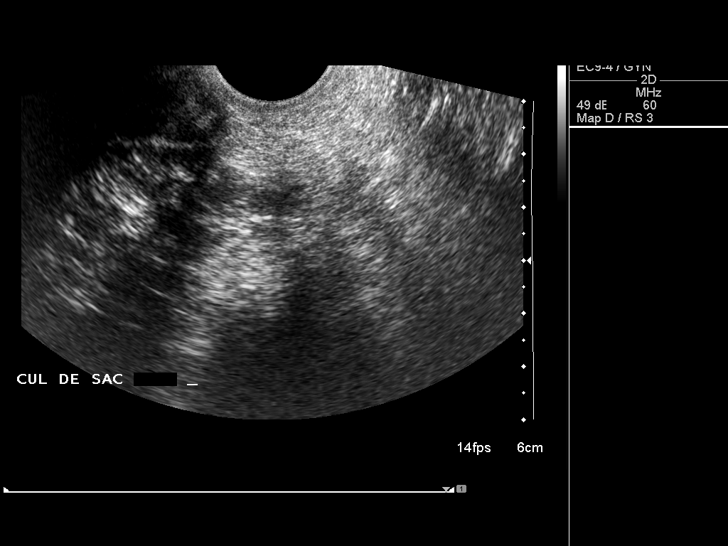
[im 48/53]
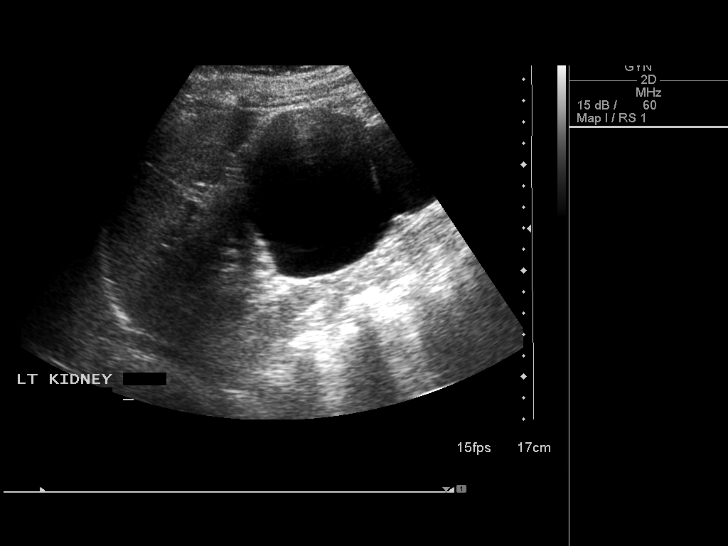
[im 53/53]
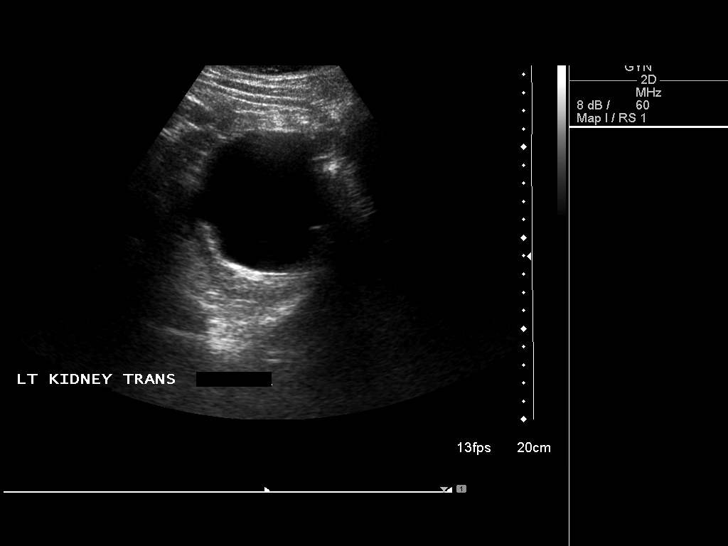

[13 of 25 positions shown; findings below may reference images not displayed]

FINDINGS: Uterus

Measurements: 7.1 x 3.7 x 4.6 cm. No fibroids or other mass
visualized.

Endometrium

Thickness: 4 mm.  No focal abnormality visualized.

Right ovary

Measurements: Not directly visualized, however no adnexal mass
identified.

Left ovary

Measurements: Not directly visualized, however no adnexal mass
identified.

Other findings

No abnormal free fluid.

Other: A cystic lesion with several thin septations is seen arising
from the left kidney which measures 10.7 x 9.0 x 8.6 cm.
IMPRESSION: Normal appearance of uterus.

Nonvisualization of ovaries, however no adnexal mass identified.

10.7 cm mildly complex cyst arising from left kidney. Renal protocol
abdomen MRI without and with contrast recommended for further
characterization.

## 2018-07-27 IMAGING — US US ASPIRATION
1 series · 12 of 12 positions shown · non-contrast
Comparison: none

INDICATION: 60-year-old female presents with flank pain secondary to left renal
cyst.

[Series 1: us aspiration · 0.25mm/px · 12 of 12 slices shown]
[im 1/12]
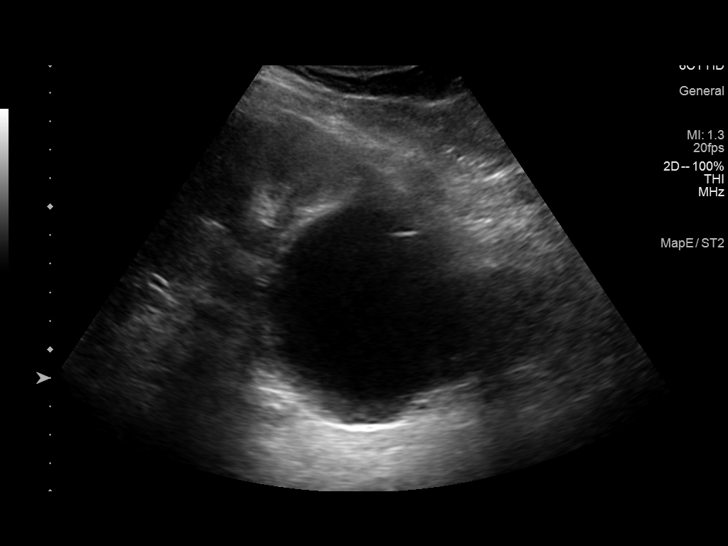
[im 2/12]
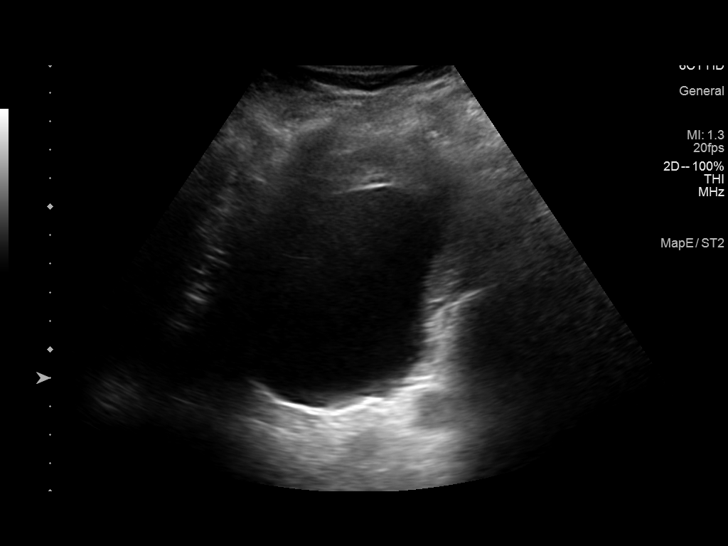
[im 3/12]
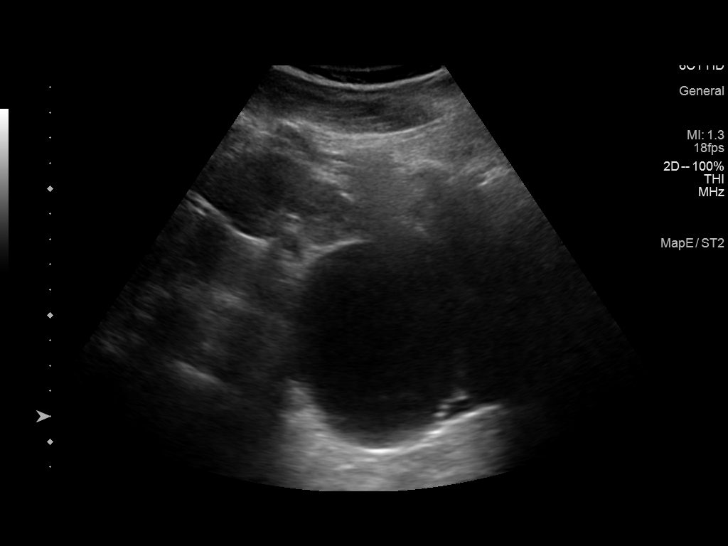
[im 4/12]
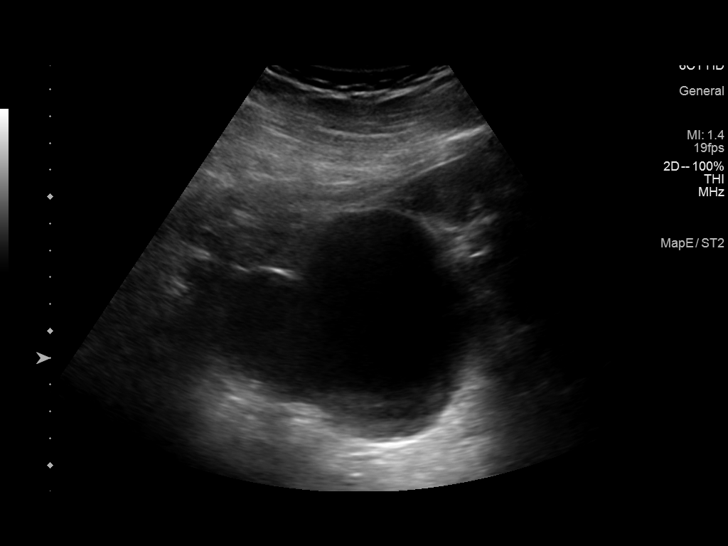
[im 5/12]
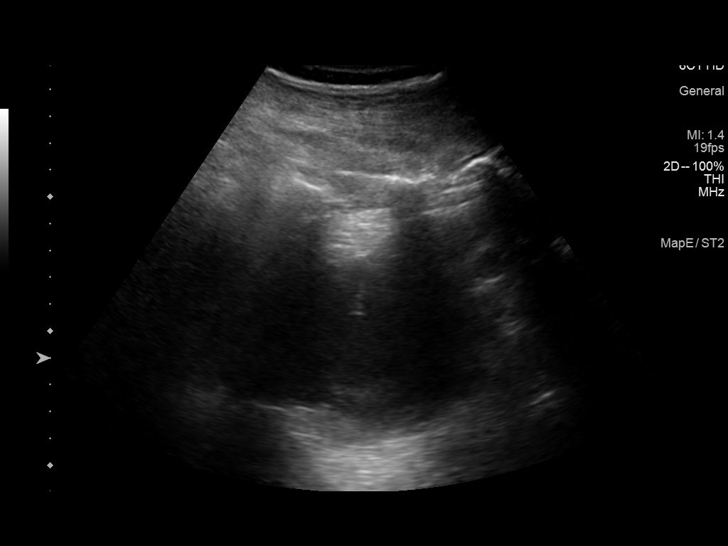
[im 6/12]
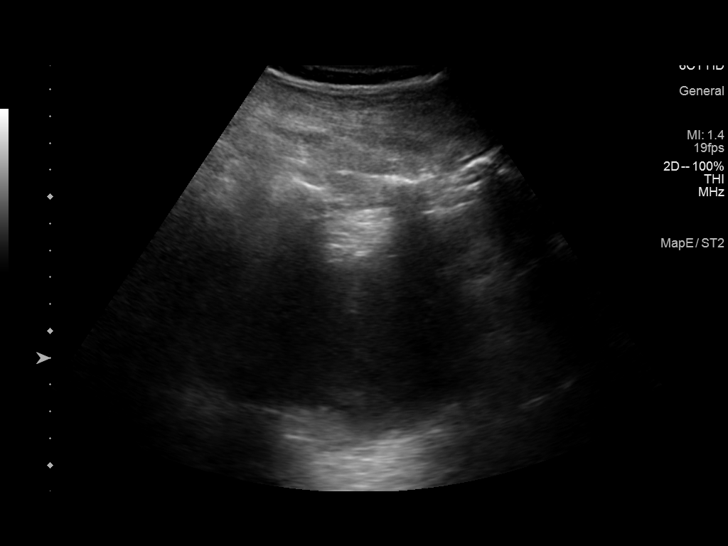
[im 7/12]
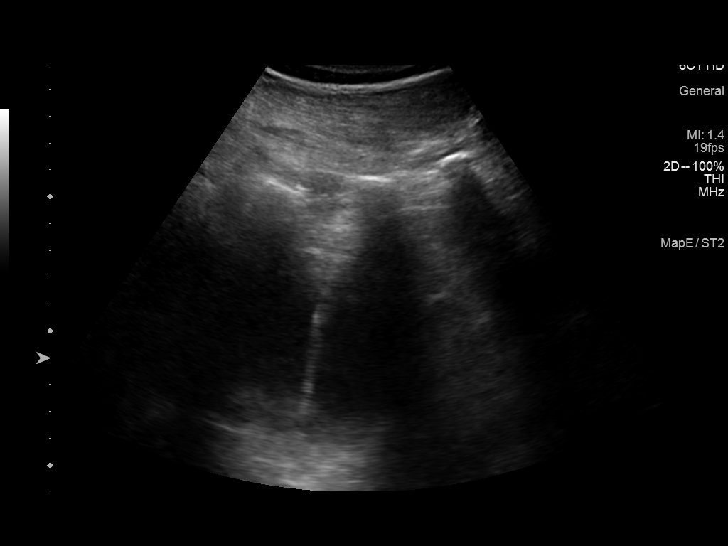
[im 8/12]
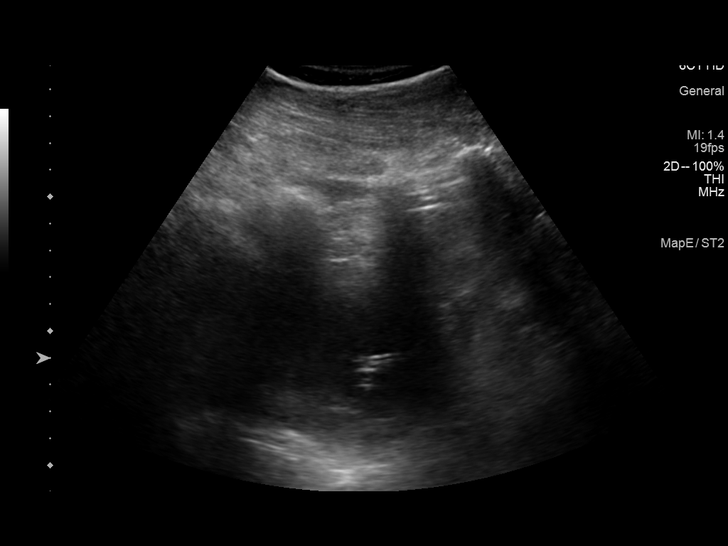
[im 9/12]
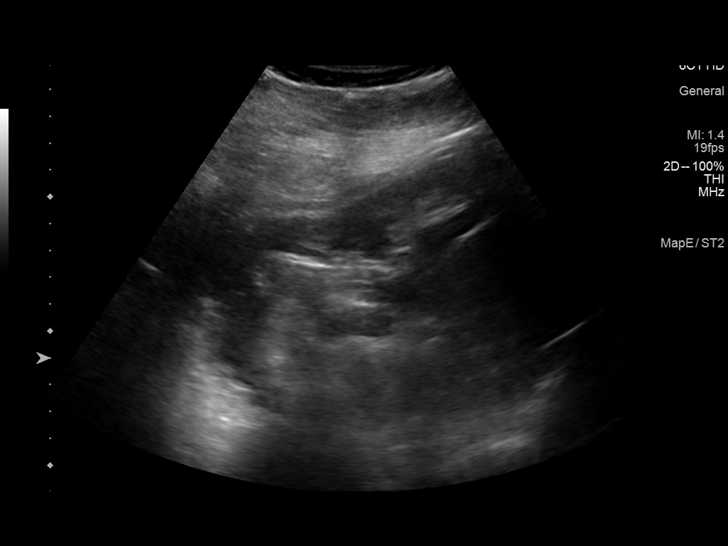
[im 10/12]
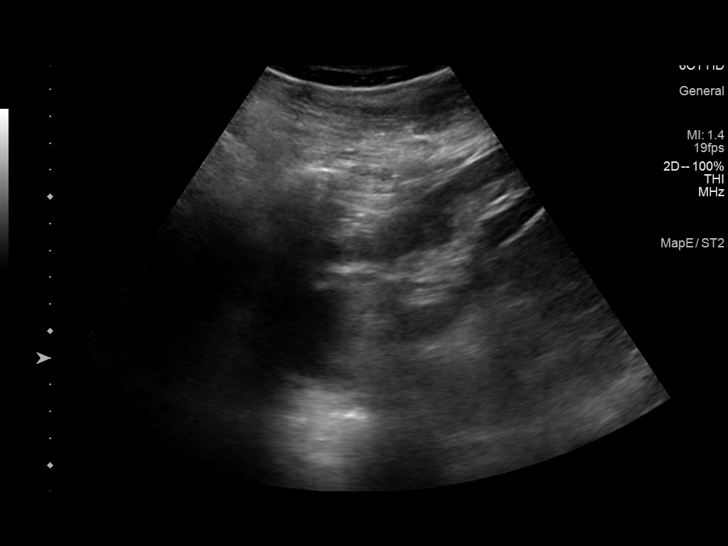
[im 11/12]
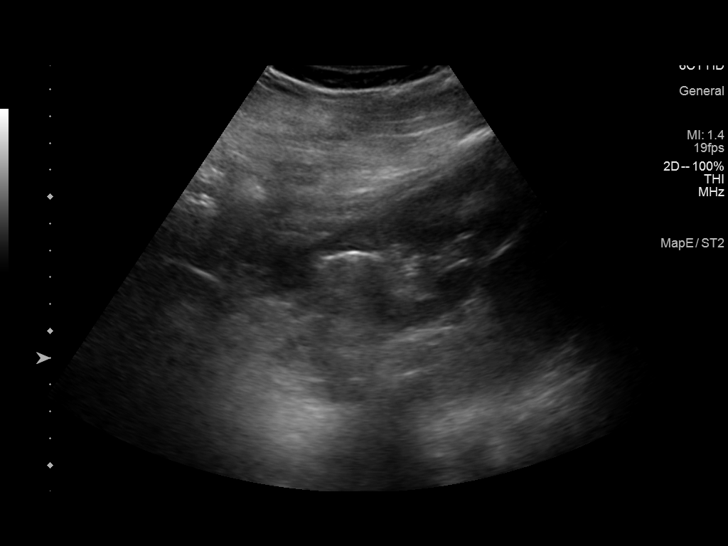
[im 12/12]
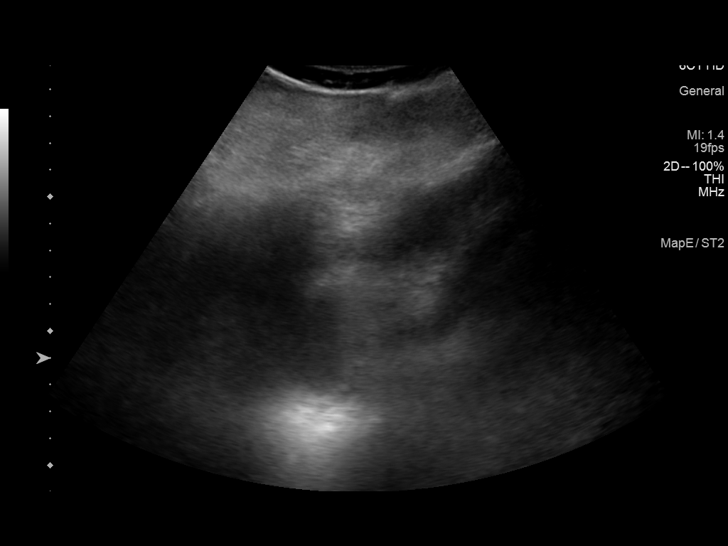

[12 of 12 positions shown; findings below may reference images not displayed]

She has been referred for evaluation an aspiration.

EXAM:
US ASPIRATION

MEDICATIONS:
The patient is currently admitted to the hospital and receiving
intravenous antibiotics. The antibiotics were administered within an
appropriate time frame prior to the initiation of the procedure.

ANESTHESIA/SEDATION:
Fentanyl 100 mcg IV; Versed 5.0 mg IV

Moderate Sedation Time:  18 minutes

The patient was continuously monitored during the procedure by the
interventional radiology nurse under my direct supervision.

COMPLICATIONS:
None

PROCEDURE:
Informed written consent was obtained from the patient after a
thorough discussion of the procedural risks, benefits and
alternatives. All questions were addressed. Maximal Sterile Barrier
Technique was utilized including caps, mask, sterile gowns, sterile
gloves, sterile drape, hand hygiene and skin antiseptic. A timeout
was performed prior to the initiation of the procedure.

Patient is position right decubitus an ultrasound survey of the left
flank was performed with images stored and sent to PACs.

The patient is then prepped and draped in usual sterile fashion. The
skin and subcutaneous tissues were generously infiltrated 1%
lidocaine with local anesthesia.

Small stab incision was made with 11 blade scalpel. Using ultrasound
guidance, yueh catheter was advanced with trocar technique into the
cyst. With removal of the trocar, no significant fluid was
aspirated.

035 wire was then passed through the yueh catheter, and an 8 French
drain was then placed into the cyst.

Approximately 190 cc of thin yellow fluid was aspirated. Sample sent
the lab for analysis.

Catheter was removed and a final image was stored.

Patient tolerated the procedure well and remained hemodynamically
stable throughout.

No complications were encountered and no significant blood loss
encountered.
IMPRESSION: Status post ultrasound-guided aspiration of left renal cyst, with
sample sent to the lab for analysis.
